# Patient Record
Sex: Male | Born: 1995 | Race: Black or African American | Hispanic: No | Marital: Single | Smoking: Current every day smoker
Health system: Southern US, Community
[De-identification: ages and names within clinical notes are randomized; demographics above are authoritative.]

## PROBLEM LIST (undated history)

## (undated) DIAGNOSIS — J45909 Unspecified asthma, uncomplicated: Secondary | ICD-10-CM

---

## 2015-10-22 ENCOUNTER — Inpatient Hospital Stay (HOSPITAL_COMMUNITY): Payer: Medicaid Other

## 2015-10-22 ENCOUNTER — Emergency Department (HOSPITAL_COMMUNITY): Payer: Medicaid Other

## 2015-10-22 ENCOUNTER — Inpatient Hospital Stay (HOSPITAL_COMMUNITY)
Admission: EM | Admit: 2015-10-22 | Discharge: 2015-10-24 | DRG: 027 | Disposition: A | Discharge status: Home / Self Care | Payer: Medicaid Other | Attending: Neurosurgery | Admitting: Neurosurgery

## 2015-10-22 ENCOUNTER — Inpatient Hospital Stay (HOSPITAL_COMMUNITY): Payer: Medicaid Other | Admitting: Anesthesiology

## 2015-10-22 ENCOUNTER — Encounter (HOSPITAL_COMMUNITY): Payer: Self-pay | Admitting: Emergency Medicine

## 2015-10-22 ENCOUNTER — Encounter (HOSPITAL_COMMUNITY)
Admission: EM | Disposition: A | Discharge status: Home / Self Care | Payer: Self-pay | Source: Home / Self Care | Attending: Neurosurgery

## 2015-10-22 DIAGNOSIS — S0101XA Laceration without foreign body of scalp, initial encounter: Secondary | ICD-10-CM | POA: Diagnosis present

## 2015-10-22 DIAGNOSIS — S020XXB Fracture of vault of skull, initial encounter for open fracture: Principal | ICD-10-CM | POA: Diagnosis present

## 2015-10-22 DIAGNOSIS — S0291XA Unspecified fracture of skull, initial encounter for closed fracture: Secondary | ICD-10-CM | POA: Diagnosis present

## 2015-10-22 DIAGNOSIS — F17210 Nicotine dependence, cigarettes, uncomplicated: Secondary | ICD-10-CM | POA: Diagnosis present

## 2015-10-22 DIAGNOSIS — Z452 Encounter for adjustment and management of vascular access device: Secondary | ICD-10-CM

## 2015-10-22 HISTORY — DX: Unspecified asthma, uncomplicated: J45.909

## 2015-10-22 HISTORY — PX: CRANIECTOMY FOR DEPRESSED SKULL FRACTURE: SHX5788

## 2015-10-22 LAB — BASIC METABOLIC PANEL
Anion gap: 8 (ref 5–15)
BUN: 9 mg/dL (ref 6–20)
CALCIUM: 9.5 mg/dL (ref 8.9–10.3)
CHLORIDE: 104 mmol/L (ref 101–111)
CO2: 26 mmol/L (ref 22–32)
CREATININE: 0.98 mg/dL (ref 0.61–1.24)
GFR calc Af Amer: >60 mL/min (ref >60)
Glucose, Bld: 105 mg/dL — ABNORMAL HIGH (ref 65–99)
Potassium: 4.4 mmol/L (ref 3.5–5.1)
SODIUM: 138 mmol/L (ref 135–145)

## 2015-10-22 LAB — CBC WITH DIFFERENTIAL/PLATELET
Basophils Absolute: 0 10*3/uL (ref 0.0–0.1)
Basophils Relative: 0 %
EOS ABS: 0 10*3/uL (ref 0.0–0.7)
EOS PCT: 0 %
HCT: 46.4 % (ref 39.0–52.0)
Hemoglobin: 15.8 g/dL (ref 13.0–17.0)
LYMPHS ABS: 1.6 10*3/uL (ref 0.7–4.0)
Lymphocytes Relative: 10 %
MCH: 30 pg (ref 26.0–34.0)
MCHC: 34.1 g/dL (ref 30.0–36.0)
MCV: 88.2 fL (ref 78.0–100.0)
MONO ABS: 1.6 10*3/uL — AB (ref 0.1–1.0)
MONOS PCT: 10 %
Neutro Abs: 12.5 10*3/uL — ABNORMAL HIGH (ref 1.7–7.7)
Neutrophils Relative %: 80 %
PLATELETS: 211 10*3/uL (ref 150–400)
RBC: 5.26 MIL/uL (ref 4.22–5.81)
RDW: 13.2 % (ref 11.5–15.5)
WBC: 15.8 10*3/uL — ABNORMAL HIGH (ref 4.0–10.5)

## 2015-10-22 LAB — TYPE AND SCREEN
ABO/RH(D): A POS
Antibody Screen: NEGATIVE

## 2015-10-22 LAB — APTT: APTT: 30 s (ref 24–36)

## 2015-10-22 LAB — MRSA PCR SCREENING: MRSA BY PCR: NEGATIVE

## 2015-10-22 LAB — PROTIME-INR
INR: 1.09
PROTHROMBIN TIME: 14.2 s (ref 11.4–15.2)

## 2015-10-22 LAB — ABO/RH: ABO/RH(D): A POS

## 2015-10-22 SURGERY — CRANIECTOMY FOR DEPRESSED SKULL FRACTURE
Anesthesia: General | Site: Head | Laterality: Left

## 2015-10-22 MED ORDER — WHITE PETROLATUM GEL
Status: AC
  Administered 2015-10-22: 22:00:00
  Filled 2015-10-22: qty 1

## 2015-10-22 MED ORDER — ALBUTEROL SULFATE HFA 108 (90 BASE) MCG/ACT IN AERS
INHALATION_SPRAY | RESPIRATORY_TRACT | Status: AC
  Filled 2015-10-22: qty 6.7

## 2015-10-22 MED ORDER — FENTANYL CITRATE (PF) 100 MCG/2ML IJ SOLN
INTRAMUSCULAR | Status: AC
  Filled 2015-10-22: qty 4

## 2015-10-22 MED ORDER — ROCURONIUM BROMIDE 10 MG/ML (PF) SYRINGE
PREFILLED_SYRINGE | INTRAVENOUS | Status: DC | PRN
  Administered 2015-10-22: 50 mg via INTRAVENOUS

## 2015-10-22 MED ORDER — ONDANSETRON HCL 4 MG/2ML IJ SOLN: 4.0000 mg | Freq: Four times a day (QID) | INTRAMUSCULAR | Status: DC | PRN

## 2015-10-22 MED ORDER — ONDANSETRON HCL 4 MG/2ML IJ SOLN
INTRAMUSCULAR | Status: AC
  Filled 2015-10-22: qty 2

## 2015-10-22 MED ORDER — SUGAMMADEX SODIUM 200 MG/2ML IV SOLN
INTRAVENOUS | Status: AC
  Filled 2015-10-22: qty 4

## 2015-10-22 MED ORDER — FENTANYL CITRATE (PF) 100 MCG/2ML IJ SOLN
INTRAMUSCULAR | Status: AC
  Filled 2015-10-22: qty 2

## 2015-10-22 MED ORDER — THROMBIN 5000 UNITS EX SOLR
CUTANEOUS | Status: DC | PRN
  Administered 2015-10-22: 15:00:00 via TOPICAL

## 2015-10-22 MED ORDER — 0.9 % SODIUM CHLORIDE (POUR BTL) OPTIME
TOPICAL | Status: DC | PRN
  Administered 2015-10-22 (×2): 1000 mL

## 2015-10-22 MED ORDER — PROPOFOL 10 MG/ML IV BOLUS
INTRAVENOUS | Status: AC
  Filled 2015-10-22: qty 40

## 2015-10-22 MED ORDER — BUPIVACAINE HCL (PF) 0.5 % IJ SOLN
INTRAMUSCULAR | Status: DC | PRN
  Administered 2015-10-22: 5 mL

## 2015-10-22 MED ORDER — HYDROCODONE-ACETAMINOPHEN 5-325 MG PO TABS
1.0000 | ORAL_TABLET | ORAL | Status: DC | PRN
  Administered 2015-10-22 – 2015-10-23 (×5): 1 via ORAL
  Filled 2015-10-22 (×5): qty 1

## 2015-10-22 MED ORDER — CEFAZOLIN SODIUM-DEXTROSE 2-3 GM-% IV SOLR
INTRAVENOUS | Status: DC | PRN
  Administered 2015-10-22: 2 g via INTRAVENOUS

## 2015-10-22 MED ORDER — MIDAZOLAM HCL 2 MG/2ML IJ SOLN
INTRAMUSCULAR | Status: AC
  Filled 2015-10-22: qty 2

## 2015-10-22 MED ORDER — BACITRACIN ZINC 500 UNIT/GM EX OINT
TOPICAL_OINTMENT | CUTANEOUS | Status: DC | PRN
  Administered 2015-10-22: 1 via TOPICAL

## 2015-10-22 MED ORDER — LIDOCAINE 2% (20 MG/ML) 5 ML SYRINGE
INTRAMUSCULAR | Status: AC
  Filled 2015-10-22: qty 5

## 2015-10-22 MED ORDER — ONDANSETRON HCL 4 MG/2ML IJ SOLN
INTRAMUSCULAR | Status: DC | PRN
  Administered 2015-10-22: 4 mg via INTRAVENOUS

## 2015-10-22 MED ORDER — LACTATED RINGERS IV SOLN: INTRAVENOUS | Status: DC | PRN

## 2015-10-22 MED ORDER — SODIUM CHLORIDE 0.9 % IV SOLN
INTRAVENOUS | Status: DC
  Administered 2015-10-22 – 2015-10-24 (×3): via INTRAVENOUS
  Filled 2015-10-22 (×2): qty 1000

## 2015-10-22 MED ORDER — FENTANYL CITRATE (PF) 100 MCG/2ML IJ SOLN
INTRAMUSCULAR | Status: DC | PRN
  Administered 2015-10-22: 100 ug via INTRAVENOUS
  Administered 2015-10-22 (×2): 50 ug via INTRAVENOUS

## 2015-10-22 MED ORDER — LIDOCAINE-EPINEPHRINE 1 %-1:100000 IJ SOLN
INTRAMUSCULAR | Status: DC | PRN
  Administered 2015-10-22: 5 mL

## 2015-10-22 MED ORDER — OXYCODONE-ACETAMINOPHEN 5-325 MG PO TABS
2.0000 | ORAL_TABLET | Freq: Once | ORAL | Status: AC
  Administered 2015-10-22: 2 via ORAL
  Filled 2015-10-22: qty 2

## 2015-10-22 MED ORDER — CEFAZOLIN SODIUM 1 G IJ SOLR
INTRAMUSCULAR | Status: AC
  Filled 2015-10-22: qty 20

## 2015-10-22 MED ORDER — FAMOTIDINE IN NACL 20-0.9 MG/50ML-% IV SOLN: 20.0000 mg | INTRAVENOUS | Status: DC

## 2015-10-22 MED ORDER — ALBUTEROL SULFATE HFA 108 (90 BASE) MCG/ACT IN AERS
INHALATION_SPRAY | RESPIRATORY_TRACT | Status: DC | PRN
  Administered 2015-10-22: 6 via RESPIRATORY_TRACT

## 2015-10-22 MED ORDER — FAMOTIDINE IN NACL 20-0.9 MG/50ML-% IV SOLN
20.0000 mg | INTRAVENOUS | Status: DC
  Administered 2015-10-22 – 2015-10-23 (×2): 20 mg via INTRAVENOUS
  Filled 2015-10-22 (×2): qty 50

## 2015-10-22 MED ORDER — PROPOFOL 10 MG/ML IV BOLUS
INTRAVENOUS | Status: DC | PRN
  Administered 2015-10-22: 140 mg via INTRAVENOUS

## 2015-10-22 MED ORDER — CEFAZOLIN IN D5W 1 GM/50ML IV SOLN
1.0000 g | Freq: Three times a day (TID) | INTRAVENOUS | Status: AC
  Administered 2015-10-22 – 2015-10-23 (×3): 1 g via INTRAVENOUS
  Filled 2015-10-22 (×4): qty 50

## 2015-10-22 MED ORDER — CEFAZOLIN IN D5W 1 GM/50ML IV SOLN
1.0000 g | Freq: Once | INTRAVENOUS | Status: AC
  Administered 2015-10-22: 1 g via INTRAVENOUS
  Filled 2015-10-22: qty 50

## 2015-10-22 MED ORDER — ROCURONIUM BROMIDE 10 MG/ML (PF) SYRINGE
PREFILLED_SYRINGE | INTRAVENOUS | Status: AC
  Filled 2015-10-22: qty 10

## 2015-10-22 MED ORDER — DIPHENHYDRAMINE HCL 50 MG/ML IJ SOLN
INTRAMUSCULAR | Status: DC | PRN
  Administered 2015-10-22: 12.5 mg via INTRAVENOUS

## 2015-10-22 MED ORDER — SODIUM CHLORIDE 0.9 % IV SOLN
INTRAVENOUS | Status: DC | PRN
  Administered 2015-10-22: 14:00:00 via INTRAVENOUS

## 2015-10-22 MED ORDER — THROMBIN 20000 UNITS EX SOLR
CUTANEOUS | Status: DC | PRN
  Administered 2015-10-22: 15:00:00 via TOPICAL

## 2015-10-22 MED ORDER — HYDROMORPHONE HCL 1 MG/ML IJ SOLN
0.2500 mg | INTRAMUSCULAR | Status: DC | PRN
  Administered 2015-10-22: 0.5 mg via INTRAVENOUS
  Filled 2015-10-22: qty 1

## 2015-10-22 MED ORDER — ONDANSETRON HCL 4 MG PO TABS: 4.0000 mg | ORAL_TABLET | Freq: Four times a day (QID) | ORAL | Status: DC | PRN

## 2015-10-22 MED ORDER — SUGAMMADEX SODIUM 200 MG/2ML IV SOLN
INTRAVENOUS | Status: DC | PRN
  Administered 2015-10-22 (×2): 200 mg via INTRAVENOUS

## 2015-10-22 MED ORDER — SUGAMMADEX SODIUM 200 MG/2ML IV SOLN
INTRAVENOUS | Status: AC
  Filled 2015-10-22: qty 2

## 2015-10-22 MED ORDER — SODIUM CHLORIDE 0.9 % IV SOLN
INTRAVENOUS | Status: DC | PRN
  Administered 2015-10-22: 13:00:00 via INTRAVENOUS

## 2015-10-22 SURGICAL SUPPLY — 78 items
BANDAGE GAUZE 4  KLING STR (GAUZE/BANDAGES/DRESSINGS) ×3 IMPLANT
BATTERY IQ STERILE (MISCELLANEOUS) ×3 IMPLANT
BENZOIN TINCTURE PRP APPL 2/3 (GAUZE/BANDAGES/DRESSINGS) IMPLANT
BLADE CLIPPER SURG (BLADE) ×3 IMPLANT
BLADE ULTRA TIP 2M (BLADE) IMPLANT
BNDG GAUZE ELAST 4 BULKY (GAUZE/BANDAGES/DRESSINGS) ×6 IMPLANT
BUR ACORN 6.0 PRECISION (BURR) IMPLANT
BUR ACORN 6.0MM PRECISION (BURR)
BUR MATCHSTICK NEURO 3.0 LAGG (BURR) ×3 IMPLANT
BUR SPIRAL ROUTER 2.3 (BUR) ×2 IMPLANT
BUR SPIRAL ROUTER 2.3MM (BUR) ×1
CANISTER SUCT 3000ML PPV (MISCELLANEOUS) ×6 IMPLANT
CLIP TI MEDIUM 6 (CLIP) IMPLANT
DRAPE NEUROLOGICAL W/INCISE (DRAPES) ×3 IMPLANT
DRAPE SURG 17X23 STRL (DRAPES) IMPLANT
DRAPE WARM FLUID 44X44 (DRAPE) ×3 IMPLANT
DURAPREP 6ML APPLICATOR 50/CS (WOUND CARE) IMPLANT
ELECT CAUTERY BLADE 6.4 (BLADE) ×3 IMPLANT
ELECT REM PT RETURN 9FT ADLT (ELECTROSURGICAL) ×3
ELECTRODE REM PT RTRN 9FT ADLT (ELECTROSURGICAL) ×1 IMPLANT
EVACUATOR 1/8 PVC DRAIN (DRAIN) IMPLANT
EVACUATOR SILICONE 100CC (DRAIN) IMPLANT
GAUZE SPONGE 4X4 12PLY STRL (GAUZE/BANDAGES/DRESSINGS) ×3 IMPLANT
GAUZE SPONGE 4X4 16PLY XRAY LF (GAUZE/BANDAGES/DRESSINGS) IMPLANT
GLOVE BIOGEL PI IND STRL 6.5 (GLOVE) ×3 IMPLANT
GLOVE BIOGEL PI IND STRL 7.5 (GLOVE) ×2 IMPLANT
GLOVE BIOGEL PI IND STRL 8.5 (GLOVE) ×1 IMPLANT
GLOVE BIOGEL PI INDICATOR 6.5 (GLOVE) ×6
GLOVE BIOGEL PI INDICATOR 7.5 (GLOVE) ×4
GLOVE BIOGEL PI INDICATOR 8.5 (GLOVE) ×2
GLOVE ECLIPSE 7.0 STRL STRAW (GLOVE) ×6 IMPLANT
GLOVE ECLIPSE 8.5 STRL (GLOVE) ×3 IMPLANT
GLOVE EXAM NITRILE LRG STRL (GLOVE) IMPLANT
GLOVE EXAM NITRILE XL STR (GLOVE) IMPLANT
GLOVE EXAM NITRILE XS STR PU (GLOVE) IMPLANT
GLOVE SURG SS PI 6.5 STRL IVOR (GLOVE) ×3 IMPLANT
GOWN STRL REUS W/ TWL LRG LVL3 (GOWN DISPOSABLE) ×2 IMPLANT
GOWN STRL REUS W/ TWL XL LVL3 (GOWN DISPOSABLE) IMPLANT
GOWN STRL REUS W/TWL 2XL LVL3 (GOWN DISPOSABLE) ×3 IMPLANT
GOWN STRL REUS W/TWL LRG LVL3 (GOWN DISPOSABLE) ×4
GOWN STRL REUS W/TWL XL LVL3 (GOWN DISPOSABLE)
HEMOSTAT POWDER KIT SURGIFOAM (HEMOSTASIS) ×3 IMPLANT
HEMOSTAT SURGICEL 2X14 (HEMOSTASIS) IMPLANT
KIT BASIN OR (CUSTOM PROCEDURE TRAY) ×3 IMPLANT
KIT ROOM TURNOVER OR (KITS) ×3 IMPLANT
NEEDLE HYPO 25X1 1.5 SAFETY (NEEDLE) ×3 IMPLANT
NS IRRIG 1000ML POUR BTL (IV SOLUTION) ×3 IMPLANT
PACK CRANIOTOMY (CUSTOM PROCEDURE TRAY) ×3 IMPLANT
PATTIES SURGICAL .5 X.5 (GAUZE/BANDAGES/DRESSINGS) IMPLANT
PATTIES SURGICAL .5 X3 (DISPOSABLE) IMPLANT
PATTIES SURGICAL 1X1 (DISPOSABLE) IMPLANT
PLATE 1.5 6HOLE XLONG DBL Y (Plate) ×12 IMPLANT
PLATE 1.5/0.5 25MM BURR HOLE (Plate) ×3 IMPLANT
PLATE 1.5/0.5 4HOLE NEURO REC (Plate) ×3 IMPLANT
SCREW SELF DRILL HT 1.5/4MM (Screw) ×48 IMPLANT
SPONGE NEURO XRAY DETECT 1X3 (DISPOSABLE) IMPLANT
SPONGE SURGIFOAM ABS GEL 100 (HEMOSTASIS) ×3 IMPLANT
STAPLER VISISTAT 35W (STAPLE) ×3 IMPLANT
STOCKINETTE 6  STRL (DRAPES)
STOCKINETTE 6 STRL (DRAPES) IMPLANT
SUT ETHILON 3 0 FSL (SUTURE) IMPLANT
SUT ETHILON 3 0 PS 1 (SUTURE) IMPLANT
SUT NURALON 4 0 TR CR/8 (SUTURE) IMPLANT
SUT STEEL 0 (SUTURE)
SUT STEEL 0 18XMFL TIE 17 (SUTURE) IMPLANT
SUT VIC AB 0 CT1 18XCR BRD8 (SUTURE) ×2 IMPLANT
SUT VIC AB 0 CT1 8-18 (SUTURE) ×4
SUT VIC AB 3-0 SH 8-18 (SUTURE) ×6 IMPLANT
SYR CONTROL 10ML LL (SYRINGE) IMPLANT
TAPE SURG TRANSPORE 1 IN (GAUZE/BANDAGES/DRESSINGS) ×1 IMPLANT
TAPE SURGICAL TRANSPORE 1 IN (GAUZE/BANDAGES/DRESSINGS) ×2
TOWEL OR 17X24 6PK STRL BLUE (TOWEL DISPOSABLE) ×3 IMPLANT
TOWEL OR 17X26 10 PK STRL BLUE (TOWEL DISPOSABLE) ×3 IMPLANT
TRAY FOLEY W/METER SILVER 16FR (SET/KITS/TRAYS/PACK) IMPLANT
TUBE CONNECTING 12'X1/4 (SUCTIONS)
TUBE CONNECTING 12X1/4 (SUCTIONS) IMPLANT
UNDERPAD 30X30 (UNDERPADS AND DIAPERS) IMPLANT
WATER STERILE IRR 1000ML POUR (IV SOLUTION) ×3 IMPLANT
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
## 2015-10-22 NOTE — Anesthesia Preprocedure Evaluation (Addendum)
Anesthesia Evaluation  Patient identified by MRN, date of birth, ID band Patient awake    Reviewed: Allergy & Precautions, H&P , NPO status , Patient's Chart, lab work & pertinent test results  Airway Mallampati: II  TM Distance: >3 FB Neck ROM: Full  Mouth opening: Limited Mouth Opening  Dental no notable dental hx. (+) Teeth Intact, Dental Advisory Given   Pulmonary asthma , Current Smoker,    Pulmonary exam normal breath sounds clear to auscultation       Cardiovascular negative cardio ROS   Rhythm:Regular Rate:Normal     Neuro/Psych negative neurological ROS  negative psych ROS   GI/Hepatic negative GI ROS, Neg liver ROS,   Endo/Other  negative endocrine ROS  Renal/GU negative Renal ROS  negative genitourinary   Musculoskeletal   Abdominal   Peds  Hematology negative hematology ROS (+)   Anesthesia Other Findings   Reproductive/Obstetrics negative OB ROS                            Anesthesia Physical Anesthesia Plan  ASA: II and emergent  Anesthesia Plan: General   Post-op Pain Management:    Induction: Intravenous  Airway Management Planned: Oral ETT  Additional Equipment: Arterial line and CVP  Intra-op Plan:   Post-operative Plan: Extubation in OR  Informed Consent: I have reviewed the patients History and Physical, chart, labs and discussed the procedure including the risks, benefits and alternatives for the proposed anesthesia with the patient or authorized representative who has indicated his/her understanding and acceptance.   Dental advisory given  Plan Discussed with: CRNA  Anesthesia Plan Comments:        Anesthesia Quick Evaluation

## 2015-10-22 NOTE — Anesthesia Postprocedure Evaluation (Signed)
Anesthesia Post Note  Patient: Brian Wagner  Procedure(s) Performed: Procedure(s) (LRB): Debridement and closure of Scalp Wound, Elevation of Skull Fracture (Left)  Patient location during evaluation: PACU Anesthesia Type: General Level of consciousness: awake and alert Pain management: pain level controlled Vital Signs Assessment: post-procedure vital signs reviewed and stable Respiratory status: spontaneous breathing, nonlabored ventilation, respiratory function stable and patient connected to face mask oxygen Cardiovascular status: blood pressure returned to baseline and stable Postop Assessment: no signs of nausea or vomiting Anesthetic complications: no    Last Vitals:  Vitals:   10/22/15 1550 10/22/15 1609  BP: 136/81   Pulse:    Resp: 17   Temp: 36.6 C 36.3 C    Last Pain:  Vitals:   10/22/15 1200  TempSrc: Oral  PainSc: 9                  Ahnesti Townsend,W. EDMOND
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
## 2015-10-22 NOTE — ED Notes (Signed)
Neurosurgery rounding at bedside 

## 2015-10-22 NOTE — Anesthesia Procedure Notes (Addendum)
Central Venous Catheter Insertion Performed by: anesthesiologist 10/22/2015 1:25 PM Patient location: Pre-op. Preanesthetic checklist: patient identified, IV checked, site marked, risks and benefits discussed, surgical consent, monitors and equipment checked, pre-op evaluation, timeout performed and anesthesia consent Position: Trendelenburg Lidocaine 1% used for infiltration Landmarks identified and Seldinger technique used Catheter size: 8 Fr Central line was placed.Double lumen Procedure performed using ultrasound guided technique. Attempts: 2 (Attempted [place] line. Unable to find vein. Arterial puncture with 18G needle.) Following insertion, dressing applied, line sutured and Biopatch. Post procedure assessment: blood return through all ports. Patient tolerated the procedure well with no immediate complications.

## 2015-10-22 NOTE — ED Notes (Signed)
Attempted to call report to 3M x 1 ?

## 2015-10-22 NOTE — Transfer of Care (Signed)
Immediate Anesthesia Transfer of Care Note  Patient: Brian Wagner  Procedure(s) Performed: Procedure(s): Debridement and closure of Scalp Wound, Elevation of Skull Fracture (Left)  Patient Location: PACU  Anesthesia Type:General  Level of Consciousness: sedated and patient cooperative  Airway & Oxygen Therapy: Patient Spontanous Breathing and Patient connected to face mask oxygen  Post-op Assessment: Report given to RN, Post -op Vital signs reviewed and stable and Patient moving all extremities X 4  Post vital signs: Reviewed and stable  Last Vitals:  Vitals:   10/22/15 1200 10/22/15 1550  BP: 111/67   Pulse: 66   Resp: 15   Temp: 36.5 C 36.6 C    Last Pain:  Vitals:   10/22/15 1200  TempSrc: Oral  PainSc: 9          Complications: No apparent anesthesia complications

## 2015-10-22 NOTE — ED Notes (Signed)
Neuro was paged at [phone]

## 2015-10-22 NOTE — ED Notes (Signed)
Neuro paged 
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
## 2015-10-23 ENCOUNTER — Encounter (HOSPITAL_COMMUNITY): Payer: Self-pay | Admitting: Neurosurgery

## 2015-10-23 MED ORDER — ORAL CARE MOUTH RINSE
15.0000 mL | Freq: Two times a day (BID) | OROMUCOSAL | Status: DC
  Administered 2015-10-23 (×2): 15 mL via OROMUCOSAL

## 2015-10-23 MED ORDER — HYDROCODONE-ACETAMINOPHEN 5-325 MG PO TABS
1.0000 | ORAL_TABLET | ORAL | Status: DC | PRN
  Administered 2015-10-23 – 2015-10-24 (×4): 2 via ORAL
  Filled 2015-10-23 (×4): qty 2

## 2015-10-23 MED ORDER — CHLORHEXIDINE GLUCONATE 0.12 % MT SOLN
15.0000 mL | Freq: Two times a day (BID) | OROMUCOSAL | Status: DC
  Administered 2015-10-23: 15 mL via OROMUCOSAL
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
## 2015-10-24 MED ORDER — HYDROCODONE-ACETAMINOPHEN 5-325 MG PO TABS: 1.0000 | ORAL_TABLET | ORAL | Status: DC | PRN

## 2015-10-24 MED ORDER — HYDROCODONE-ACETAMINOPHEN 5-325 MG PO TABS: 1.0000 | ORAL_TABLET | Freq: Four times a day (QID) | ORAL | 0 refills | Status: DC | PRN

## 2015-10-24 NOTE — Discharge Summary (Signed)
  Physician Discharge Summary  Patient ID: Brian Wagner MRN: [id number] DOB/AGE: 09/17/1995 20 y.o.  Admit date: 10/22/2015 Discharge date: 10/24/2015  Admission Diagnoses: Depressed skull fracture  Discharge Diagnoses: Same Active Problems:   Depressed skull fracture [facility](HCC)   Discharged Condition: Stable  Hospital Course:  Mrs. Brian Wagner is a 20 y.o. male admitted after presenting to the ED with a open depressed skull fracture. He was taken to the OR for debridement/closure of lac and elevation of skull fracture. He was at baseline postop. He was evaluated by PT and did not require PT f/u. He was also seen by slp and will be set up with outpatient SLP.  Treatments: Surgery - debridement/closure of wound, elevation of skull fracture  Discharge Exam: Blood pressure 125/77, pulse (!) 58, temperature 98.9 F (37.2 C), temperature source Oral, resp. rate 18, height 5\' 6"  (1.676 m), weight 59 kg (130 lb), SpO2 100 %. Awake, alert, oriented Speech fluent, appropriate CN grossly intact 5/5 BUE/BLE Wound c/d/i  Disposition: Home     Medication List    TAKE these medications   HYDROcodone-acetaminophen 5-325 MG tablet Commonly known as:  NORCO/VICODIN Take 1 tablet by mouth every 6 (six) hours as needed for moderate pain.      Follow-up Information    Alexsus Papadopoulos, C, MD Follow up in 2 week(s).   Specialty:  Neurosurgery Contact information: [address] [place] [address] 360 [phone]           Signed: Lisbeth RenshawUNDKUMAR, Demiah Gullickson, Salena SanerC 10/24/2015, 2:06 PM

## 2015-10-24 NOTE — Progress Notes (Signed)
Patient ambulated around the unit x2 very well with no issues. No weakness noted.

## 2015-10-24 NOTE — Progress Notes (Signed)
No issues overnight. Pt reports left-side HA and facial pain. Reports severe pain, falls asleep during my visit.  EXAM:  BP 125/77   Pulse (!) 58   Temp 98.9 F (37.2 C) (Oral)   Resp 18   Ht 5\' 6"  (1.676 m)   Wt 59 kg (130 lb)   SpO2 100%   BMI 20.98 kg/m   Sleepy, but easily arousable Speech fluent, appropriate  CN grossly intact  5/5 BUE/BLE  Wounds c/d/i  IMPRESSION:  20 y.o. male s/p elevation of depressed fracture, doing well. Has been eval by PT, no PT f/u needed. Will need outpatient SLP  PLAN: - Will plan on d/c home today

## 2015-10-24 NOTE — Discharge Instructions (Signed)
Ok to English as a second language teachershower tomorrow.  Keep wound dry during day. Walk as much as possible.
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
## 2020-11-05 ENCOUNTER — Other Ambulatory Visit: Payer: Self-pay

## 2020-11-05 ENCOUNTER — Emergency Department
Admission: EM | Admit: 2020-11-05 | Discharge: 2020-11-05 | Disposition: A | Discharge status: Home / Self Care | Payer: Self-pay | Attending: Emergency Medicine | Admitting: Emergency Medicine

## 2020-11-05 DIAGNOSIS — F17210 Nicotine dependence, cigarettes, uncomplicated: Secondary | ICD-10-CM | POA: Insufficient documentation

## 2020-11-05 DIAGNOSIS — R197 Diarrhea, unspecified: Secondary | ICD-10-CM | POA: Insufficient documentation

## 2020-11-05 DIAGNOSIS — J45909 Unspecified asthma, uncomplicated: Secondary | ICD-10-CM | POA: Insufficient documentation

## 2020-11-05 DIAGNOSIS — Z20822 Contact with and (suspected) exposure to covid-19: Secondary | ICD-10-CM | POA: Insufficient documentation

## 2020-11-05 DIAGNOSIS — R112 Nausea with vomiting, unspecified: Secondary | ICD-10-CM | POA: Insufficient documentation

## 2020-11-05 LAB — RESP PANEL BY RT-PCR (FLU A&B, COVID) ARPGX2
Influenza A by PCR: NEGATIVE
Influenza B by PCR: NEGATIVE
SARS Coronavirus 2 by RT PCR: NEGATIVE

## 2020-11-05 MED ORDER — ONDANSETRON 4 MG PO TBDP: 4.0000 mg | ORAL_TABLET | Freq: Three times a day (TID) | ORAL | 0 refills | Status: AC | PRN

## 2020-11-05 NOTE — Discharge Instructions (Addendum)
Follow up with your regular doctor if not better in 3 days.  Return to the ER if worsening

## 2020-11-05 NOTE — ED Provider Notes (Signed)
[facility] Emergency Department Provider Note  ____________________________________________   Event Date/Time   First MD Initiated Contact with Patient 11/05/20 1148     (approximate)  I have reviewed the triage vital signs and the nursing notes.   HISTORY  Chief Complaint Generalized Body Aches, Emesis, and Diarrhea    HPI Brian Wagner is a 25 y.o. male presents to the emergency department with body aches for 2 days days.   Is complaining of vomiting x 2 and multiple episodes of diarrhea, denies cough, congestion, fever, chills, chest pain, shortness of breath positive close contact with Covid19+ patient,    Past Medical History:  Diagnosis Date   Asthma     Patient Active Problem List   Diagnosis Date Noted   Depressed skull fracture (HCC) 10/22/2015    Past Surgical History:  Procedure Laterality Date   CRANIECTOMY FOR DEPRESSED SKULL FRACTURE Left 10/22/2015   Procedure: Debridement and closure of Scalp Wound, Elevation of Skull Fracture;  Surgeon: Lisbeth Renshaw, MD;  Location: [facility];  Service: Neurosurgery;  Laterality: Left;    Prior to Admission medications   Medication Sig Start Date End Date Taking? Authorizing Provider  ondansetron (ZOFRAN-ODT) 4 MG disintegrating tablet Take 1 tablet (4 mg total) by mouth every 8 (eight) hours as needed. 11/05/20  Yes Faythe Ghee, PA-C    Allergies Patient has no known allergies.  History reviewed. No pertinent family history.  Social History Social History   Tobacco Use   Smoking status: Every Day    Types: Cigarettes   Smokeless tobacco: Current  Substance Use Topics   Alcohol use: No   Drug use: Yes    Types: Marijuana    Review of Systems  Constitutional: no fever/chills Eyes: No visual changes. ENT: no sore throat. Respiratory: no cough Cardiovascular:  no chest pain Gastrointestinal: Denies abdominal pain Genitourinary: Negative for dysuria. Musculoskeletal:  Negative for back pain. Skin: Negative for rash. Neurological: no neurological changes    ____________________________________________   PHYSICAL EXAM:  VITAL SIGNS: ED Triage Vitals  Enc Vitals Group     BP 11/05/20 1053 120/71     Pulse Rate 11/05/20 1053 61     Resp 11/05/20 1053 16     Temp 11/05/20 1053 98.8 F (37.1 C)     Temp Source 11/05/20 1053 Oral     SpO2 11/05/20 1053 100 %     Weight 11/05/20 1054 125 lb (56.7 kg)     Height 11/05/20 1054 5' 5.5" (1.664 m)     Head Circumference --      Peak Flow --      Pain Score 11/05/20 1054 8     Pain Loc --      Pain Edu? --      Excl. in GC? --     Constitutional: Alert and oriented. Well appearing and in no acute distress. Eyes: Conjunctivae are normal.  Head: Atraumatic. Nose: No congestion/rhinnorhea. Mouth/Throat: Mucous membranes are moist.   Neck:  supple no lymphadenopathy noted Cardiovascular: Normal rate, regular rhythm. Heart sounds are normal Respiratory: Normal respiratory effort.  No retractions, lungs cta GU: deferred Musculoskeletal: FROM all extremities, warm and well perfused Neurologic:  Normal speech and language.  Skin:  Skin is warm, dry and intact. No rash noted. Psychiatric: Mood and affect are normal. Speech and behavior are normal.  ____________________________________________   LABS (all labs ordered are listed, but only abnormal results are displayed)  Labs Reviewed  RESP PANEL  BY RT-PCR (FLU A&B, COVID) [id number]   ____________________________________________   ____________________________________________  RADIOLOGY    ____________________________________________   PROCEDURES  Procedure(s) performed: No  Procedures    ____________________________________________   INITIAL IMPRESSION / ASSESSMENT AND PLAN / ED COURSE  Pertinent labs & imaging results that were available during my care of the patient were reviewed by me and considered in my medical decision  making (see chart for details).   Patient is a 25 year old male who complains of gastroenteritis type symptoms.    Pending test for covid  Zofran sent to the pharmacy.  The patient was instructed to quarantine themselves at home.  Follow-up with your regular doctor if any concerns.  Return emergency department for worsening. OTC measures discussed     Brian Wagner was evaluated in Emergency Department on 11/05/2020 for the symptoms described in the history of present illness. He was evaluated in the context of the global COVID-19 pandemic, which necessitated consideration that the patient might be at risk for infection with the SARS-CoV-2 virus that causes COVID-19. Institutional protocols and algorithms that pertain to the evaluation of patients at risk for COVID-19 are in a state of rapid change based on information released by regulatory bodies including the CDC and federal and state organizations. These policies and algorithms were followed during the patient's care in the ED.   As part of my medical decision making, I reviewed the following data within the electronic MEDICAL RECORD NUMBER History obtained from family, Nursing notes reviewed and incorporated, Labs reviewed , Old chart reviewed, Notes from prior ED visits, and [place] Controlled Substance Database  ____________________________________________   FINAL CLINICAL IMPRESSION(S) / ED DIAGNOSES  Final diagnoses:  Suspected COVID-19 virus infection  Nausea vomiting and diarrhea      NEW MEDICATIONS STARTED DURING THIS VISIT:  Discharge Medication List as of 11/05/2020 12:16 PM     START taking these medications   Details  ondansetron (ZOFRAN-ODT) 4 MG disintegrating tablet Take 1 tablet (4 mg total) by mouth every 8 (eight) hours as needed., Starting Tue 11/05/2020, Normal         Note:  This document was prepared using Dragon voice recognition software and may include unintentional dictation errors.    Faythe Ghee,  PA-C 11/05/20 1601    Sharman Cheek, MD 11/05/20 1836
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
## 2020-11-05 NOTE — ED Notes (Signed)
See triage note  presents with subjective fever and body aches which started couple of days ago  also state he is having some n/v  afebrile on arrival
